# Patient Record
Sex: Female | Born: 2005
Health system: Southern US, Community
[De-identification: ages and names within clinical notes are randomized; demographics above are authoritative.]

---
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
## 2023-02-08 ENCOUNTER — Encounter: Payer: Self-pay | Admitting: Family Medicine

## 2023-02-08 ENCOUNTER — Ambulatory Visit (INDEPENDENT_AMBULATORY_CARE_PROVIDER_SITE_OTHER): Payer: No Typology Code available for payment source | Admitting: Family Medicine

## 2023-02-08 VITALS — BP 104/70 | HR 90 | Temp 97.1°F | Resp 18 | Ht 68.0 in | Wt 141.1 lb

## 2023-02-08 DIAGNOSIS — Z8659 Personal history of other mental and behavioral disorders: Secondary | ICD-10-CM

## 2023-02-08 DIAGNOSIS — Z7689 Persons encountering health services in other specified circumstances: Secondary | ICD-10-CM

## 2023-02-08 DIAGNOSIS — R5382 Chronic fatigue, unspecified: Secondary | ICD-10-CM

## 2023-02-08 NOTE — Progress Notes (Signed)
New Patient Office Visit  Subjective    Patient ID: Tara Alvarez, female    DOB: [birth year]  Age: 17 y.o. MRN: [id number]  CC:  Chief Complaint  Patient presents with   Establish Care    Patient is here to establish care with a new PCP, she states that for the past month she has been feeling super tired and doesn't know why    HPI Tara Alvarez presents to establish care. They are from [place].  Fatigue Pt reports extreme fatigue for the last month. Mother reports disordered sleeping in the last month. She reports getting in bed at 9-10am and can fall asleep easily. For school she gets up at 7am. She denies snoring and morning headaches. She reports feeling the need to take naps. She reports hx of irregular menses last year where she had 2 periods within a month. She denies hair shed.  She reports good mood. Mother reports pt was in a facility last year for eating disorder and was there a month, in [place]. She reports she likes to starve herself and she's been doing this in the last 2 years. She denies starving currently. She use to see counseling but stopped last October and states she didn't need it anymore.    No outpatient encounter medications on file as of 02/08/2023.   No facility-administered encounter medications on file as of 02/08/2023.    History reviewed. No pertinent past medical history.  History reviewed. No pertinent surgical history.  Family History  Problem Relation Age of Onset   Healthy Mother    Healthy Father     Social History   Socioeconomic History   Marital status: Single    Spouse name: Not on file   Number of children: Not on file   Years of education: Not on file   Highest education level: Not on file  Occupational History   Not on file  Tobacco Use   Smoking status: Never    Passive exposure: Never   Smokeless tobacco: Never  Vaping Use   Vaping status: Never Used  Substance and Sexual Activity   Alcohol use: Not Currently   Drug use:  Not Currently   Sexual activity: Not Currently  Other Topics Concern   Not on file  Social History Narrative   Not on file   Social Determinants of Health   Financial Resource Strain: Not on file  Food Insecurity: Not on file  Transportation Needs: Not on file  Physical Activity: Not on file  Stress: Not on file  Social Connections: Not on file  Intimate Partner Violence: Not on file    Review of Systems  Constitutional:  Positive for malaise/fatigue.  All other systems reviewed and are negative.      Objective    BP 104/70   Pulse 90   Temp (!) 97.1 F (36.2 C) (Oral)   Resp 18   Ht 5\' 8"  (1.727 m)   Wt 141 lb 1.6 oz (64 kg)   LMP 01/15/2023   SpO2 98%   BMI 21.45 kg/m   Physical Exam Vitals and nursing note reviewed.  Constitutional:      Appearance: Normal appearance. She is normal weight.  HENT:     Head: Normocephalic and atraumatic.     Right Ear: External ear normal.     Left Ear: External ear normal.     Nose: Nose normal.     Mouth/Throat:     Mouth: Mucous membranes are moist.  Pharynx: Oropharynx is clear.  Eyes:     Conjunctiva/sclera: Conjunctivae normal.     Pupils: Pupils are equal, round, and reactive to light.  Cardiovascular:     Rate and Rhythm: Normal rate and regular rhythm.     Pulses: Normal pulses.     Heart sounds: Normal heart sounds.  Pulmonary:     Effort: Pulmonary effort is normal.     Breath sounds: Normal breath sounds.  Abdominal:     General: Abdomen is flat. Bowel sounds are normal.  Skin:    General: Skin is warm.     Capillary Refill: Capillary refill takes less than 2 seconds.  Neurological:     General: No focal deficit present.     Mental Status: She is alert and oriented to person, place, and time. Mental status is at baseline.  Psychiatric:        Mood and Affect: Mood normal.        Behavior: Behavior normal.        Thought Content: Thought content normal.        Judgment: Judgment normal.        Assessment & Plan:   Problem List Items Addressed This Visit   None  Encounter to establish care with new doctor  Chronic fatigue -     CBC with Differential/Platelet -     Comprehensive metabolic panel -     Hemoglobin A1c -     TSH -     T4, free -     Vitamin B12 -     VITAMIN D 25 Hydroxy (Vit-D Deficiency, Fractures)  Hx of eating disorder  Start with baseline screening for metabolic and vitamin deficiencies due to hx of eating disorder. Will follow up pending results.  No follow-ups on file.   Suzan Slick, MD

## 2023-02-09 LAB — CBC WITH DIFFERENTIAL/PLATELET
Basophils Absolute: 0 10*3/uL (ref 0.0–0.2)
Basos: 1 %
EOS (ABSOLUTE): 0 10*3/uL (ref 0.0–0.4)
Eos: 1 %
Hematocrit: 41.1 % (ref 34.0–46.6)
Hemoglobin: 13.2 g/dL (ref 11.1–15.9)
Immature Grans (Abs): 0 10*3/uL (ref 0.0–0.1)
Immature Granulocytes: 0 %
Lymphocytes Absolute: 2 10*3/uL (ref 0.7–3.1)
Lymphs: 36 %
MCH: 28.3 pg (ref 26.6–33.0)
MCHC: 32.1 g/dL (ref 31.5–35.7)
MCV: 88 fL (ref 79–97)
Monocytes Absolute: 0.4 10*3/uL (ref 0.1–0.9)
Monocytes: 7 %
Neutrophils Absolute: 3.2 10*3/uL (ref 1.4–7.0)
Neutrophils: 55 %
Platelets: 269 10*3/uL (ref 150–450)
RBC: 4.67 x10E6/uL (ref 3.77–5.28)
RDW: 11.6 % — ABNORMAL LOW (ref 11.7–15.4)
WBC: 5.7 10*3/uL (ref 3.4–10.8)

## 2023-02-09 LAB — COMPREHENSIVE METABOLIC PANEL
ALT: 10 IU/L (ref 0–32)
AST: 13 IU/L (ref 0–40)
Albumin: 4.7 g/dL (ref 4.0–5.0)
Alkaline Phosphatase: 69 IU/L (ref 42–106)
BUN/Creatinine Ratio: 18 (ref 9–23)
BUN: 13 mg/dL (ref 6–20)
Bilirubin Total: 0.5 mg/dL (ref 0.0–1.2)
CO2: 22 mmol/L (ref 20–29)
Calcium: 9.9 mg/dL (ref 8.7–10.2)
Chloride: 104 mmol/L (ref 96–106)
Creatinine, Ser: 0.72 mg/dL (ref 0.57–1.00)
Globulin, Total: 3.2 g/dL (ref 1.5–4.5)
Glucose: 77 mg/dL (ref 70–99)
Potassium: 4.7 mmol/L (ref 3.5–5.2)
Sodium: 139 mmol/L (ref 134–144)
Total Protein: 7.9 g/dL (ref 6.0–8.5)
eGFR: 123 mL/min/{1.73_m2} (ref >59)

## 2023-02-09 LAB — TSH: TSH: 1.55 u[IU]/mL (ref 0.450–4.500)

## 2023-02-09 LAB — HEMOGLOBIN A1C
Est. average glucose Bld gHb Est-mCnc: 91 mg/dL
Hgb A1c MFr Bld: 4.8 % (ref 4.8–5.6)

## 2023-02-09 LAB — T4, FREE: Free T4: 1.2 ng/dL (ref 0.93–1.60)

## 2023-02-09 LAB — VITAMIN B12: Vitamin B-12: 428 pg/mL (ref 232–1245)

## 2023-02-09 LAB — VITAMIN D 25 HYDROXY (VIT D DEFICIENCY, FRACTURES): Vit D, 25-Hydroxy: 17.7 ng/mL — ABNORMAL LOW (ref 30.0–100.0)

## 2023-02-12 ENCOUNTER — Other Ambulatory Visit: Payer: Self-pay | Admitting: Family Medicine

## 2023-02-12 DIAGNOSIS — E559 Vitamin D deficiency, unspecified: Secondary | ICD-10-CM

## 2023-02-12 MED ORDER — VITAMIN D (ERGOCALCIFEROL) 1.25 MG (50000 UNIT) PO CAPS: 50000.0000 [IU] | ORAL_CAPSULE | ORAL | 1 refills | Status: AC

## 2023-03-13 IMAGING — CR DG WRIST COMPLETE 3+V*L*
4 series · 4 of 4 positions shown · non-contrast
Comparison: None.

CLINICAL DATA: Fall, left wrist pain

EXAM:
LEFT WRIST - COMPLETE 3+ VIEW

[wrist pa]
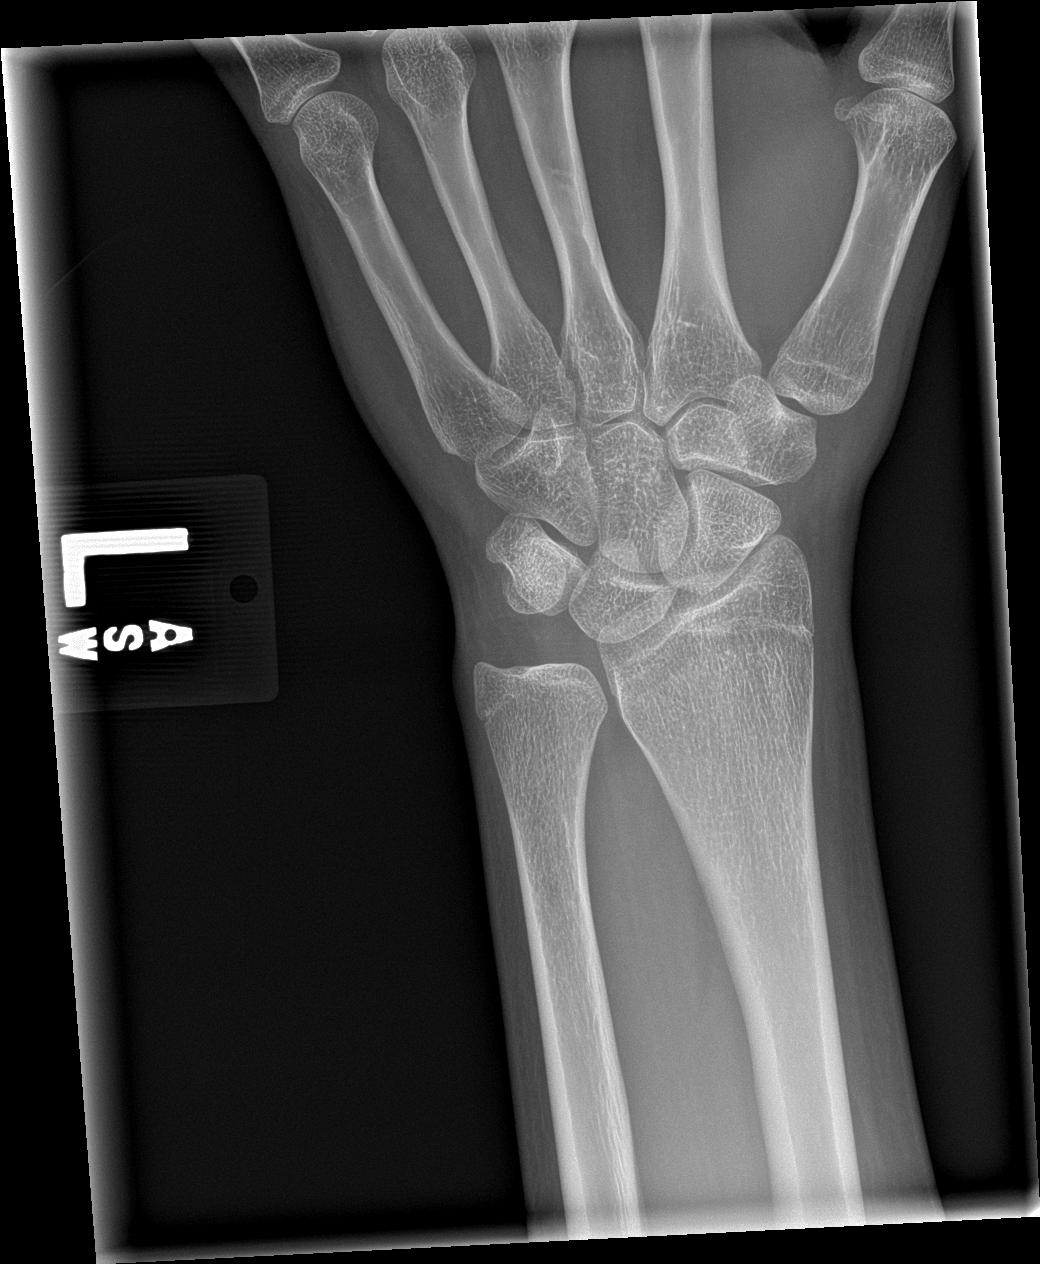

[wrist obl]
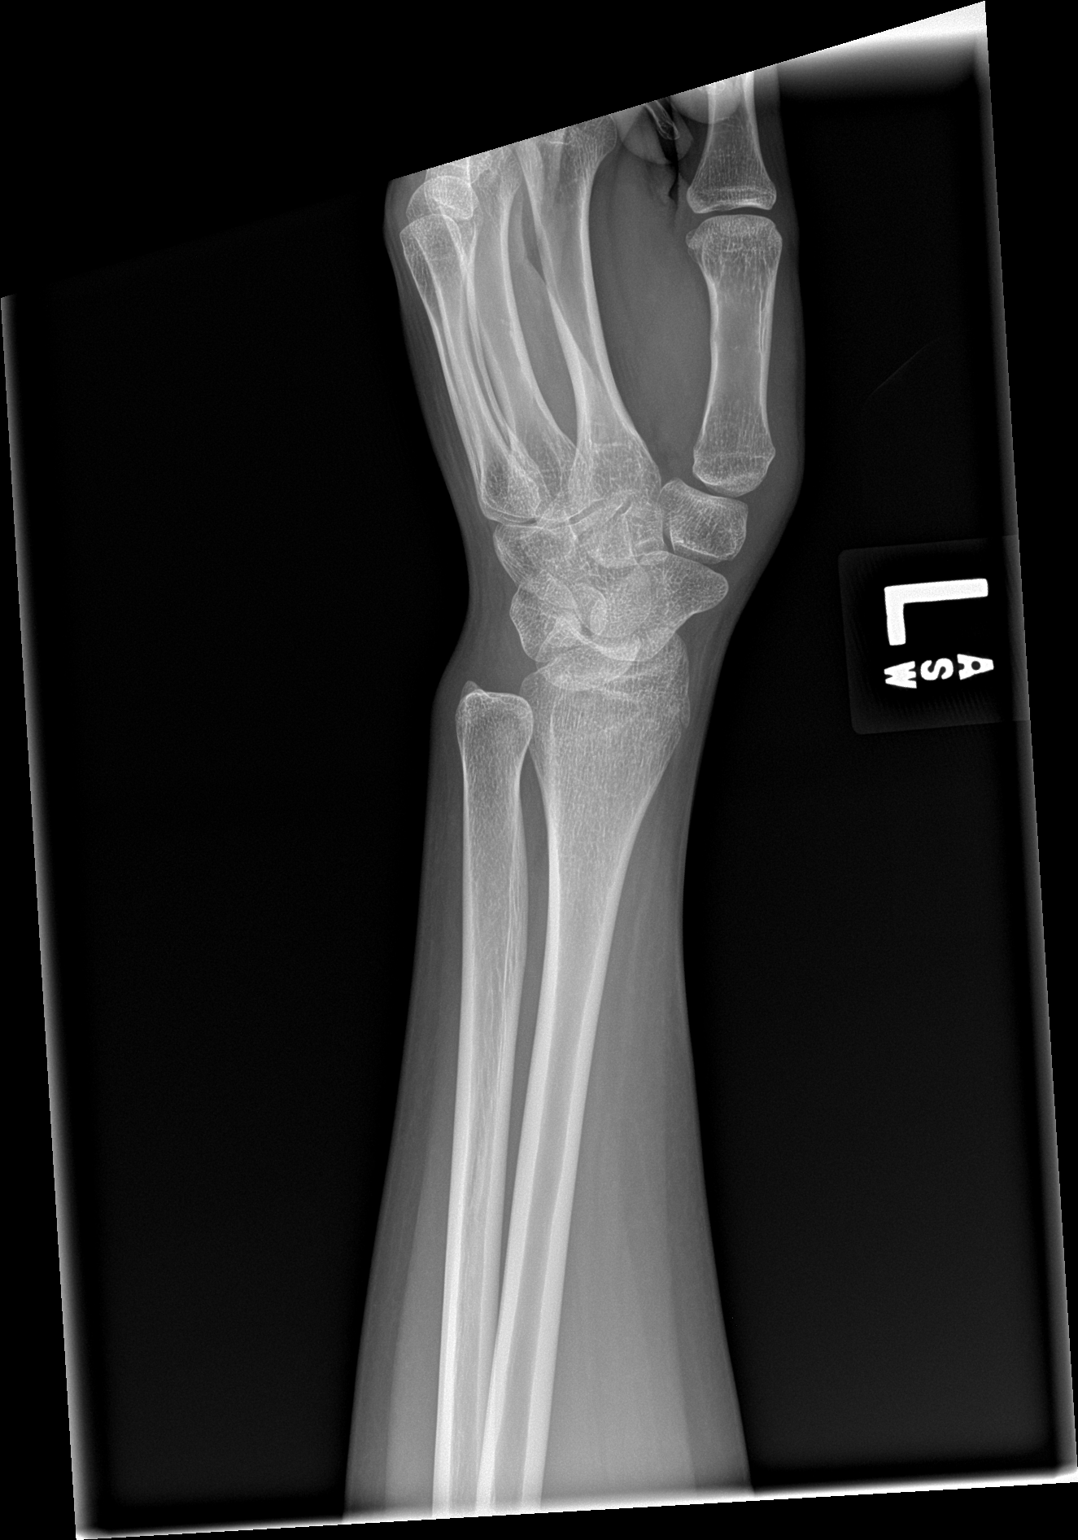

[wrist lat]
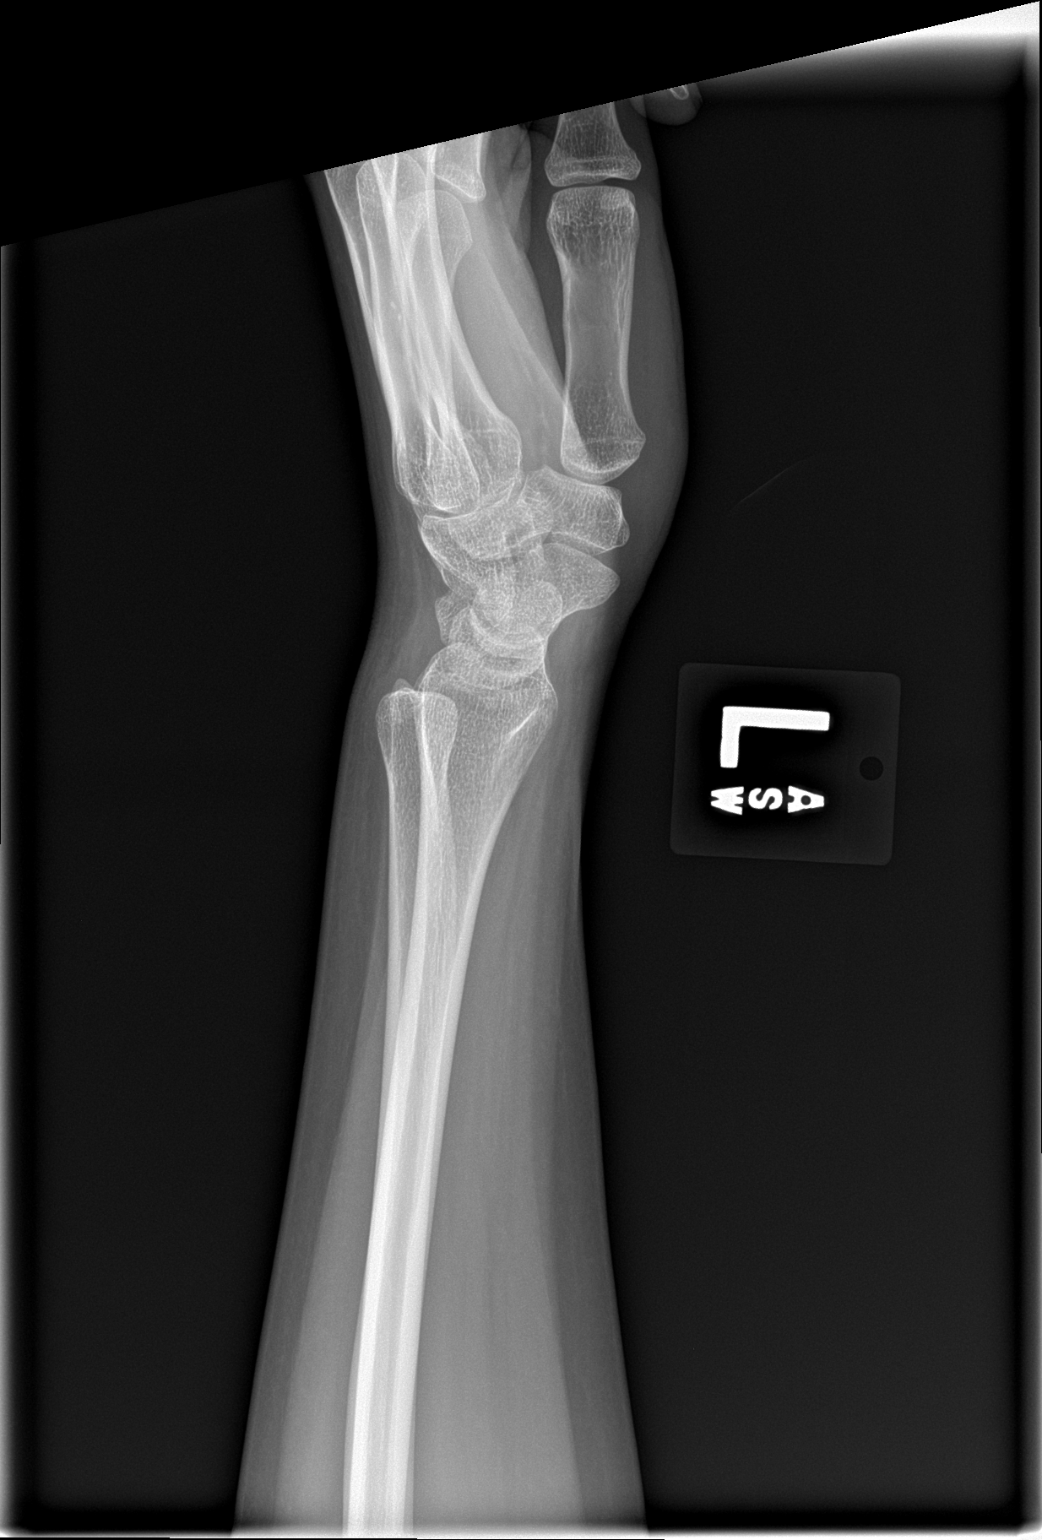

[wrist navicular]
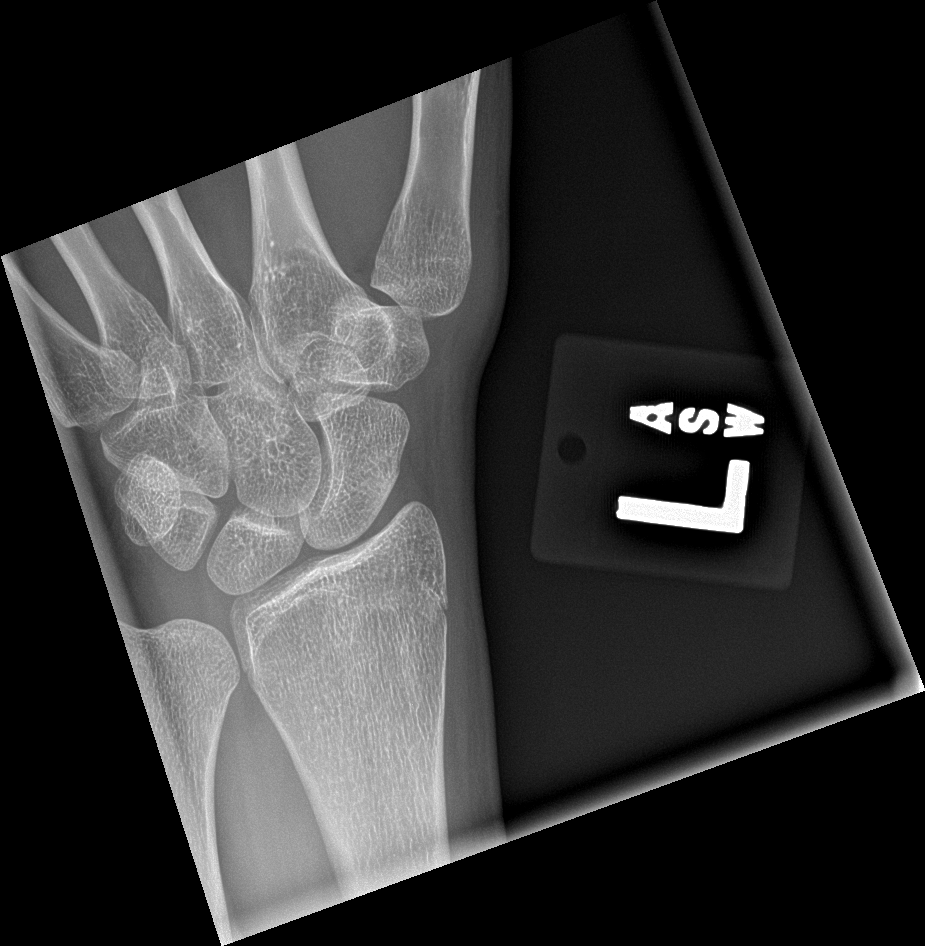

[4 of 4 positions shown; findings below may reference images not displayed]

FINDINGS: No fracture or dislocation is seen.

The joint spaces are preserved.

The visualized soft tissues are unremarkable.
IMPRESSION: Negative.

## 2023-09-23 ENCOUNTER — Ambulatory Visit
Admission: EM | Admit: 2023-09-23 | Discharge: 2023-09-23 | Disposition: A | Discharge status: Home / Self Care | Attending: Physician Assistant | Admitting: Physician Assistant

## 2023-09-23 DIAGNOSIS — H6012 Cellulitis of left external ear: Secondary | ICD-10-CM

## 2023-09-23 MED ORDER — CEPHALEXIN 500 MG PO CAPS: 500.0000 mg | ORAL_CAPSULE | Freq: Three times a day (TID) | ORAL | 0 refills | Status: AC

## 2023-09-23 NOTE — ED Provider Notes (Signed)
 [facility]    CSN: [id number] Arrival date & time: 09/23/23  1721      History   Chief Complaint Chief Complaint  Patient presents with   Ear Injury    HPI Tara Alvarez is a 18 y.o. female presenting with her mother for left earlobe pain, swelling and redness for the past 2 to 3 days.  Patient reports that she pierced both earlobes herself about 2 weeks ago and the right ear is doing fine but the left one appears to have become infected.  Reports pustular drainage.  Has been keeping the area clean.  No associated fever.  HPI  History reviewed. No pertinent past medical history.  There are no active problems to display for this patient.   History reviewed. No pertinent surgical history.  OB History   No obstetric history on file.      Home Medications    Prior to Admission medications   Medication Sig Start Date End Date Taking? Authorizing Provider  cephALEXin  (KEFLEX ) 500 MG capsule Take 1 capsule (500 mg total) by mouth 3 (three) times daily for 7 days. 09/23/23 09/30/23 Yes Nancy Axon B, PA-C  Vitamin D , Ergocalciferol , (DRISDOL ) 1.25 MG (50000 UNIT) CAPS capsule Take 1 capsule (50,000 Units total) by mouth every 7 (seven) days. Take for 8 total doses(weeks) 02/12/23   Manette Section, MD    Family History Family History  Problem Relation Age of Onset   Healthy Mother    Healthy Father     Social History Social History   Tobacco Use   Smoking status: Never    Passive exposure: Never   Smokeless tobacco: Never  Vaping Use   Vaping status: Never Used  Substance Use Topics   Alcohol use: Not Currently   Drug use: Not Currently     Allergies   Patient has no known allergies.   Review of Systems Review of Systems  Constitutional:  Negative for chills, diaphoresis, fatigue and fever.  HENT:  Positive for ear pain. Negative for congestion, ear discharge and facial swelling.   Skin:  Positive for color change and wound. Negative for  rash.  Neurological:  Negative for dizziness, weakness and headaches.  Hematological:  Negative for adenopathy.     Physical Exam Triage Vital Signs ED Triage Vitals  Encounter Vitals Group     BP      Systolic BP Percentile      Diastolic BP Percentile      Pulse      Resp      Temp      Temp src      SpO2      Weight      Height      Head Circumference      Peak Flow      Pain Score      Pain Loc      Pain Education      Exclude from Growth Chart    No data found.  Updated Vital Signs BP 117/77 (BP Location: Right Arm)   Pulse 91   Temp 98.5 F (36.9 C) (Oral)   Resp 16   SpO2 98%    Physical Exam Vitals and nursing note reviewed.  Constitutional:      General: She is not in acute distress.    Appearance: Normal appearance. She is not ill-appearing or toxic-appearing.  HENT:     Head: Normocephalic and atraumatic.     Left Ear: External ear normal.  Ears:     Comments: Mild erythema, swelling and tenderness of the left earlobe.  Tiny puncture of earlobe without drainage. Eyes:     General: No scleral icterus.       Right eye: No discharge.        Left eye: No discharge.     Conjunctiva/sclera: Conjunctivae normal.  Cardiovascular:     Rate and Rhythm: Normal rate.  Pulmonary:     Effort: Pulmonary effort is normal. No respiratory distress.  Musculoskeletal:     Cervical back: Neck supple.  Skin:    General: Skin is dry.  Neurological:     General: No focal deficit present.     Mental Status: She is alert. Mental status is at baseline.     Motor: No weakness.     Gait: Gait normal.  Psychiatric:        Mood and Affect: Mood normal.        Behavior: Behavior normal.      UC Treatments / Results  Labs (all labs ordered are listed, but only abnormal results are displayed) Labs Reviewed - No data to display  EKG   Radiology No results found.  Procedures Procedures (including critical care time)  Medications Ordered in  UC Medications - No data to display  Initial Impression / Assessment and Plan / UC Course  I have reviewed the triage vital signs and the nursing notes.  Pertinent labs & imaging results that were available during my care of the patient were reviewed by me and considered in my medical decision making (see chart for details).   18 year old female presents with mother for left ear pain, swelling and redness x 2 days.  Self pierced the ear with a piercing gun she online 2 weeks.  Cellulitis, mild.  Treating this time with oral Keflex .  Discussed wound care guidelines.  Reviewed return precautions.   Final Clinical Impressions(s) / UC Diagnoses   Final diagnoses:  Cellulitis of left external ear   Discharge Instructions   None    ED Prescriptions     Medication Sig Dispense Auth. Provider   cephALEXin  (KEFLEX ) 500 MG capsule Take 1 capsule (500 mg total) by mouth 3 (three) times daily for 7 days. 21 capsule Floydene Hy, PA-C      PDMP not reviewed this encounter.   Floydene Hy, PA-C 09/23/23 1800

## 2023-09-23 NOTE — ED Triage Notes (Signed)
 Left ear lobe hurts. Patient pierced her ear herself and it got infected.

## 2024-01-19 ENCOUNTER — Ambulatory Visit
Admission: EM | Admit: 2024-01-19 | Discharge: 2024-01-19 | Disposition: A | Discharge status: Home / Self Care | Attending: Physician Assistant | Admitting: Physician Assistant

## 2024-01-19 ENCOUNTER — Encounter: Payer: Self-pay | Admitting: Emergency Medicine

## 2024-01-19 DIAGNOSIS — H01134 Eczematous dermatitis of left upper eyelid: Secondary | ICD-10-CM | POA: Diagnosis not present

## 2024-01-19 DIAGNOSIS — H01131 Eczematous dermatitis of right upper eyelid: Secondary | ICD-10-CM | POA: Diagnosis not present

## 2024-01-19 DIAGNOSIS — H01135 Eczematous dermatitis of left lower eyelid: Secondary | ICD-10-CM

## 2024-01-19 DIAGNOSIS — H01132 Eczematous dermatitis of right lower eyelid: Secondary | ICD-10-CM | POA: Diagnosis not present

## 2024-01-19 MED ORDER — FLUTICASONE PROPIONATE 0.05 % EX CREA
TOPICAL_CREAM | Freq: Two times a day (BID) | CUTANEOUS | 0 refills | Status: AC
Start: 2024-01-19 — End: ?

## 2024-01-19 MED ORDER — PREDNISONE 10 MG PO TABS: ORAL_TABLET | ORAL | 0 refills | Status: AC

## 2024-01-19 NOTE — ED Provider Notes (Signed)
 [facility]    CSN: [id number] Arrival date & time: 01/19/24  1231      History   Chief Complaint Chief Complaint  Patient presents with   Rash   Facial Swelling    HPI Tara Alvarez is a 18 y.o. female presenting with her mother for rash of face with associated swelling x 2 days. Rash is of bilateral upper and lower eyelids. Symptoms worse after being out in the sun for several hours. Has been applying aloe and a topical moisturizer without relief. History of eczema.   HPI  History reviewed. No pertinent past medical history.  There are no active problems to display for this patient.   History reviewed. No pertinent surgical history.  OB History   No obstetric history on file.      Home Medications    Prior to Admission medications   Medication Sig Start Date End Date Taking? Authorizing Provider  fluticasone  (CUTIVATE ) 0.05 % cream Apply topically 2 (two) times daily. 01/19/24  Yes Arvis Jolan NOVAK, PA-C  predniSONE  (DELTASONE ) 10 MG tablet Take 6 tabs po on day 1 and decrease by 1 tab daily  until complete 01/19/24  Yes Arvis Jolan B, PA-C  Vitamin D , Ergocalciferol , (DRISDOL ) 1.25 MG (50000 UNIT) CAPS capsule Take 1 capsule (50,000 Units total) by mouth every 7 (seven) days. Take for 8 total doses(weeks) 02/12/23   Colette Torrence GRADE, MD    Family History Family History  Problem Relation Age of Onset   Healthy Mother    Healthy Father     Social History Social History   Tobacco Use   Smoking status: Never    Passive exposure: Never   Smokeless tobacco: Never  Vaping Use   Vaping status: Never Used  Substance Use Topics   Alcohol use: Not Currently   Drug use: Never     Allergies   Patient has no known allergies.   Review of Systems Review of Systems  Constitutional:  Negative for fatigue and fever.  HENT:  Positive for facial swelling.   Skin:  Positive for rash.  Allergic/Immunologic: Positive for environmental allergies.      Physical Exam Triage Vital Signs ED Triage Vitals  Encounter Vitals Group     BP 01/19/24 1255 108/67     Girls Systolic BP Percentile --      Girls Diastolic BP Percentile --      Boys Systolic BP Percentile --      Boys Diastolic BP Percentile --      Pulse Rate 01/19/24 1255 86     Resp 01/19/24 1255 14     Temp 01/19/24 1255 97.8 F (36.6 C)     Temp Source 01/19/24 1255 Oral     SpO2 01/19/24 1255 100 %     Weight 01/19/24 1254 136 lb (61.7 kg)     Height 01/19/24 1254 5' 8 (1.727 m)     Head Circumference --      Peak Flow --      Pain Score 01/19/24 1254 6     Pain Loc --      Pain Education --      Exclude from Growth Chart --    No data found.  Updated Vital Signs BP 108/67 (BP Location: Left Arm)   Pulse 86   Temp 97.8 F (36.6 C) (Oral)   Resp 14   Ht 5' 8 (1.727 m)   Wt 136 lb (61.7 kg)   LMP 01/12/2024 (Approximate)  SpO2 100%   BMI 20.68 kg/m     Physical Exam Vitals and nursing note reviewed.  Constitutional:      General: She is not in acute distress.    Appearance: Normal appearance. She is not ill-appearing or toxic-appearing.  HENT:     Head: Normocephalic and atraumatic.  Eyes:     General: No scleral icterus.       Right eye: No discharge.        Left eye: No discharge.     Conjunctiva/sclera: Conjunctivae normal.  Cardiovascular:     Rate and Rhythm: Normal rate.  Pulmonary:     Effort: Pulmonary effort is normal. No respiratory distress.  Musculoskeletal:     Cervical back: Neck supple.  Skin:    General: Skin is dry.     Findings: Rash present.  Neurological:     General: No focal deficit present.     Mental Status: She is alert. Mental status is at baseline.     Motor: No weakness.     Gait: Gait normal.  Psychiatric:        Mood and Affect: Mood normal.        Behavior: Behavior normal.      UC Treatments / Results  Labs (all labs ordered are listed, but only abnormal results are displayed) Labs Reviewed  - No data to display  EKG   Radiology No results found.  Procedures Procedures (including critical care time)  Medications Ordered in UC Medications - No data to display  Initial Impression / Assessment and Plan / UC Course  I have reviewed the triage vital signs and the nursing notes.  Pertinent labs & imaging results that were available during my care of the patient were reviewed by me and considered in my medical decision making (see chart for details).   18 year old female presents for dry pruritic rash of bilateral upper and lower eyelids for the past couple days.  Symptoms worse after being out in the sun.  She does have a history of eczema.  No relief with topical aloe and CeraVe moisturizer.  See image including chart of patient's face.  Consistent with eczematous rash.  Treating at this time with topical fluticasone  and prednisone  taper.  Reviewed continue moisturizer.  Start antihistamines.  Reviewed return precautions.   Final Clinical Impressions(s) / UC Diagnoses   Final diagnoses:  Eczematous dermatitis of upper and lower eyelids of both eyes     Discharge Instructions      -Take antihistamines. Begin topical corticosteroid (careful not to get in your eye) and oral corticosteroids -Continue moisturizer.   ED Prescriptions     Medication Sig Dispense Auth. Provider   predniSONE  (DELTASONE ) 10 MG tablet Take 6 tabs po on day 1 and decrease by 1 tab daily  until complete 21 tablet Arvis Huxley B, PA-C   fluticasone  (CUTIVATE ) 0.05 % cream Apply topically 2 (two) times daily. 30 g Arvis Huxley KATHEE DEVONNA      PDMP not reviewed this encounter.   Arvis Huxley KATHEE, PA-C 01/19/24 1341

## 2024-01-19 NOTE — Discharge Instructions (Addendum)
-  Take antihistamines. Begin topical corticosteroid (careful not to get in your eye) and oral corticosteroids -Continue moisturizer.

## 2024-01-19 NOTE — ED Triage Notes (Signed)
 Patient states that she has sun poisoning.  Patient states that she developed rash and facial swelling two days ago.  Patient denies fevers.  Patient has been using aloe on her face.
